# Patient Record
Sex: Male | Born: 1995 | Race: Black or African American | Hispanic: No | Marital: Married | Smoking: Never smoker
Health system: Southern US, Community
[De-identification: ages and names within clinical notes are randomized; demographics above are authoritative.]

---

## 2020-03-25 NOTE — Progress Notes (Deleted)
    New patient visit   Patient: Arthur Reese   DOB: 1995/12/19   23 y.o. Male  MRN: [id number] Visit Date: 03/26/2020  Today's healthcare provider: Jairo Ben, FNP   No chief complaint on file.  Subjective    Arthur Reese is a 24 y.o. male who presents today as a new patient to establish care.  HPI  ***  No past medical history on file. *** The histories are not reviewed yet. Please review them in the "History" navigator section and refresh this SmartLink. No family status information on file.   No family history on file. Social History   Socioeconomic History  . Marital status: Not on file    Spouse name: Not on file  . Number of children: Not on file  . Years of education: Not on file  . Highest education level: Not on file  Occupational History  . Not on file  Tobacco Use  . Smoking status: Not on file  Substance and Sexual Activity  . Alcohol use: Not on file  . Drug use: Not on file  . Sexual activity: Not on file  Other Topics Concern  . Not on file  Social History Narrative  . Not on file   Social Determinants of Health   Financial Resource Strain:   . Difficulty of Paying Living Expenses:   Food Insecurity:   . Worried About Programme researcher, broadcasting/film/video in the Last Year:   . Barista in the Last Year:   Transportation Needs:   . Freight forwarder (Medical):   Marland Kitchen Lack of Transportation (Non-Medical):   Physical Activity:   . Days of Exercise per Week:   . Minutes of Exercise per Session:   Stress:   . Feeling of Stress :   Social Connections:   . Frequency of Communication with Friends and Family:   . Frequency of Social Gatherings with Friends and Family:   . Attends Religious Services:   . Active Member of Clubs or Organizations:   . Attends Banker Meetings:   Marland Kitchen Marital Status:    No outpatient medications prior to visit.   No facility-administered medications prior to visit.   Not on File   There is no  immunization history on file for this patient.  Health Maintenance  Topic Date Due  . Hepatitis C Screening  Never done  . HIV Screening  Never done  . TETANUS/TDAP  Never done  . INFLUENZA VACCINE  04/25/2020    No care team member to display  Review of Systems  {Heme  Chem  Endocrine  Serology  Results Review (optional):[id number]::" "}  Objective    There were no vitals taken for this visit. Physical Exam ***  Depression Screen No flowsheet data found. No results found for any visits on 03/26/20.  Assessment & Plan     ***  No follow-ups on file.     {provider attestation***:1}   Jairo Ben, FNP  [facility] [phone] (phone) [phone] (fax)  [facility]

## 2020-03-26 ENCOUNTER — Ambulatory Visit: Payer: Self-pay | Admitting: Adult Health

## 2020-06-15 ENCOUNTER — Ambulatory Visit
Admission: RE | Admit: 2020-06-15 | Discharge: 2020-06-15 | Disposition: A | Discharge status: Home / Self Care | Payer: Medicaid Other | Attending: Emergency Medicine | Admitting: Emergency Medicine

## 2020-06-15 ENCOUNTER — Ambulatory Visit
Admission: EM | Admit: 2020-06-15 | Discharge: 2020-06-15 | Disposition: A | Discharge status: Home / Self Care | Payer: Medicaid Other | Attending: Emergency Medicine | Admitting: Emergency Medicine

## 2020-06-15 ENCOUNTER — Other Ambulatory Visit: Payer: Self-pay

## 2020-06-15 ENCOUNTER — Ambulatory Visit
Admission: RE | Admit: 2020-06-15 | Discharge: 2020-06-15 | Disposition: A | Discharge status: Home / Self Care | Payer: Medicaid Other | Source: Ambulatory Visit | Attending: Emergency Medicine | Admitting: Emergency Medicine

## 2020-06-15 DIAGNOSIS — M25571 Pain in right ankle and joints of right foot: Secondary | ICD-10-CM | POA: Insufficient documentation

## 2020-06-15 MED ORDER — IBUPROFEN 800 MG PO TABS: 800.0000 mg | ORAL_TABLET | Freq: Three times a day (TID) | ORAL | 0 refills | Status: AC | PRN

## 2020-06-15 NOTE — ED Triage Notes (Signed)
Patient reports he injured his right ankle playing basketball last night.

## 2020-06-15 NOTE — Discharge Instructions (Addendum)
Go to [facility] for your xray.  I will call you with the result afterward.    Take the ibuprofen as prescribed.    Rest and elevate your ankle.  Apply ice packs 2-3 times a day for up to 20 minutes each.  Wear the Ace wrap as needed for comfort.    Follow up with an orthopedist if you symptoms are not improving.

## 2020-06-15 NOTE — ED Provider Notes (Signed)
Renaldo Fiddler    CSN: [id number] Arrival date & time: 06/15/20  0915      History   Chief Complaint Chief Complaint  Patient presents with  . Injury  . Foot Pain  . Foot Swelling    HPI Arthur Reese is a 24 y.o. male.   Patient presents with pain in his right ankle since yesterday evening.  He states he was playing basketball and jumped and landed on his ankle which caused pain and swelling.  He is unsure what position his ankle was and when he landed.  He states it is painful to bear weight.  He denies numbness or paresthesias.  No treatments attempted at home.  The history is provided by the patient.    History reviewed. No pertinent past medical history.  There are no problems to display for this patient.   History reviewed. No pertinent surgical history.     Home Medications    Prior to Admission medications   Medication Sig Start Date End Date Taking? Authorizing Provider  ibuprofen (ADVIL) 800 MG tablet Take 1 tablet (800 mg total) by mouth every 8 (eight) hours as needed. 06/15/20   Mickie Bail, NP    Family History History reviewed. No pertinent family history.  Social History Social History   Tobacco Use  . Smoking status: Never Smoker  . Smokeless tobacco: Never Used  Substance Use Topics  . Alcohol use: Yes    Comment: occasionally  . Drug use: Yes    Types: Marijuana     Allergies   Patient has no known allergies.   Review of Systems Review of Systems  Constitutional: Negative for chills and fever.  HENT: Negative for ear pain and sore throat.   Eyes: Negative for pain and visual disturbance.  Respiratory: Negative for cough and shortness of breath.   Cardiovascular: Negative for chest pain and palpitations.  Gastrointestinal: Negative for abdominal pain and vomiting.  Genitourinary: Negative for dysuria and hematuria.  Musculoskeletal: Positive for arthralgias, gait problem and joint swelling. Negative for back pain.  Skin:  Negative for color change and rash.  Neurological: Negative for seizures and syncope.  All other systems reviewed and are negative.    Physical Exam Triage Vital Signs ED Triage Vitals [06/15/20 0929]  Enc Vitals Group     BP      Pulse      Resp      Temp      Temp src      SpO2      Weight      Height      Head Circumference      Peak Flow      Pain Score 8     Pain Loc      Pain Edu?      Excl. in GC?    No data found.  Updated Vital Signs BP 118/78   Pulse 71   Temp 99.1 F (37.3 C)   Resp 14   SpO2 98%   Visual Acuity Right Eye Distance:   Left Eye Distance:   Bilateral Distance:    Right Eye Near:   Left Eye Near:    Bilateral Near:     Physical Exam Vitals and nursing note reviewed.  Constitutional:      General: He is not in acute distress.    Appearance: He is well-developed.  HENT:     Head: Normocephalic and atraumatic.     Mouth/Throat:  Mouth: Mucous membranes are moist.  Eyes:     Conjunctiva/sclera: Conjunctivae normal.  Cardiovascular:     Rate and Rhythm: Normal rate and regular rhythm.     Heart sounds: No murmur heard.   Pulmonary:     Effort: Pulmonary effort is normal. No respiratory distress.     Breath sounds: Normal breath sounds.  Abdominal:     Palpations: Abdomen is soft.     Tenderness: There is no abdominal tenderness.  Musculoskeletal:        General: Swelling and tenderness present. No deformity.     Cervical back: Neck supple.       Legs:     Comments: Right ankle mild edema and tenderness. 2+ pulses, sensation intact.  ROM limited by discomfort.  No lesions, ecchymosis, erythema.   Skin:    General: Skin is warm and dry.     Capillary Refill: Capillary refill takes less than 2 seconds.     Findings: No bruising, erythema, lesion or rash.  Neurological:     General: No focal deficit present.     Mental Status: He is alert and oriented to person, place, and time.     Sensory: No sensory deficit.      Motor: No weakness.     Gait: Gait abnormal.  Psychiatric:        Mood and Affect: Mood normal.        Behavior: Behavior normal.      UC Treatments / Results  Labs (all labs ordered are listed, but only abnormal results are displayed) Labs Reviewed - No data to display  EKG   Radiology DG Ankle Complete Right  Result Date: 06/15/2020 CLINICAL DATA:  Basketball injury. EXAM: RIGHT ANKLE - COMPLETE 3+ VIEW COMPARISON:  No prior. FINDINGS: No acute bony or joint abnormality identified. No evidence of fracture or dislocation. Mild soft tissue swelling cannot be excluded. IMPRESSION: No acute bony abnormality. Electronically Signed   By: Maisie Fus  Register   On: 06/15/2020 10:31    Procedures Procedures (including critical care time)  Medications Ordered in UC Medications - No data to display  Initial Impression / Assessment and Plan / UC Course  I have reviewed the triage vital signs and the nursing notes.  Pertinent labs & imaging results that were available during my care of the patient were reviewed by me and considered in my medical decision making (see chart for details).   Acute right ankle pain.  Xray negative.  Treating with ibuprofen, rest, elevation, ice packs, Ace wrap, crutches.  Instructed patient to follow-up with orthopedics if his symptoms are not improving.  Patient agrees to plan of care.   Final Clinical Impressions(s) / UC Diagnoses   Final diagnoses:  Acute right ankle pain     Discharge Instructions     Go to [facility] for your xray.  I will call you with the result afterward.    Take the ibuprofen as prescribed.    Rest and elevate your ankle.  Apply ice packs 2-3 times a day for up to 20 minutes each.  Wear the Ace wrap as needed for comfort.    Follow up with an orthopedist if you symptoms are not improving.       ED Prescriptions    Medication Sig Dispense Auth. Provider   ibuprofen (ADVIL) 800 MG tablet  Take 1 tablet (800 mg total) by mouth every 8 (eight) hours as needed. 21 tablet Mickie Bail, NP  PDMP not reviewed this encounter.   Mickie Bail, NP 06/15/20 1043

## 2021-03-24 IMAGING — CR DG ANKLE COMPLETE 3+V*R*
1 series · 3 of 3 positions shown · non-contrast
Comparison: No prior.

CLINICAL DATA: Basketball injury.

EXAM:
RIGHT ANKLE - COMPLETE 3+ VIEW

[Series 1: dg ankle complete right · 0.14mm/px · 3 of 3 slices shown]
[im 1/3]
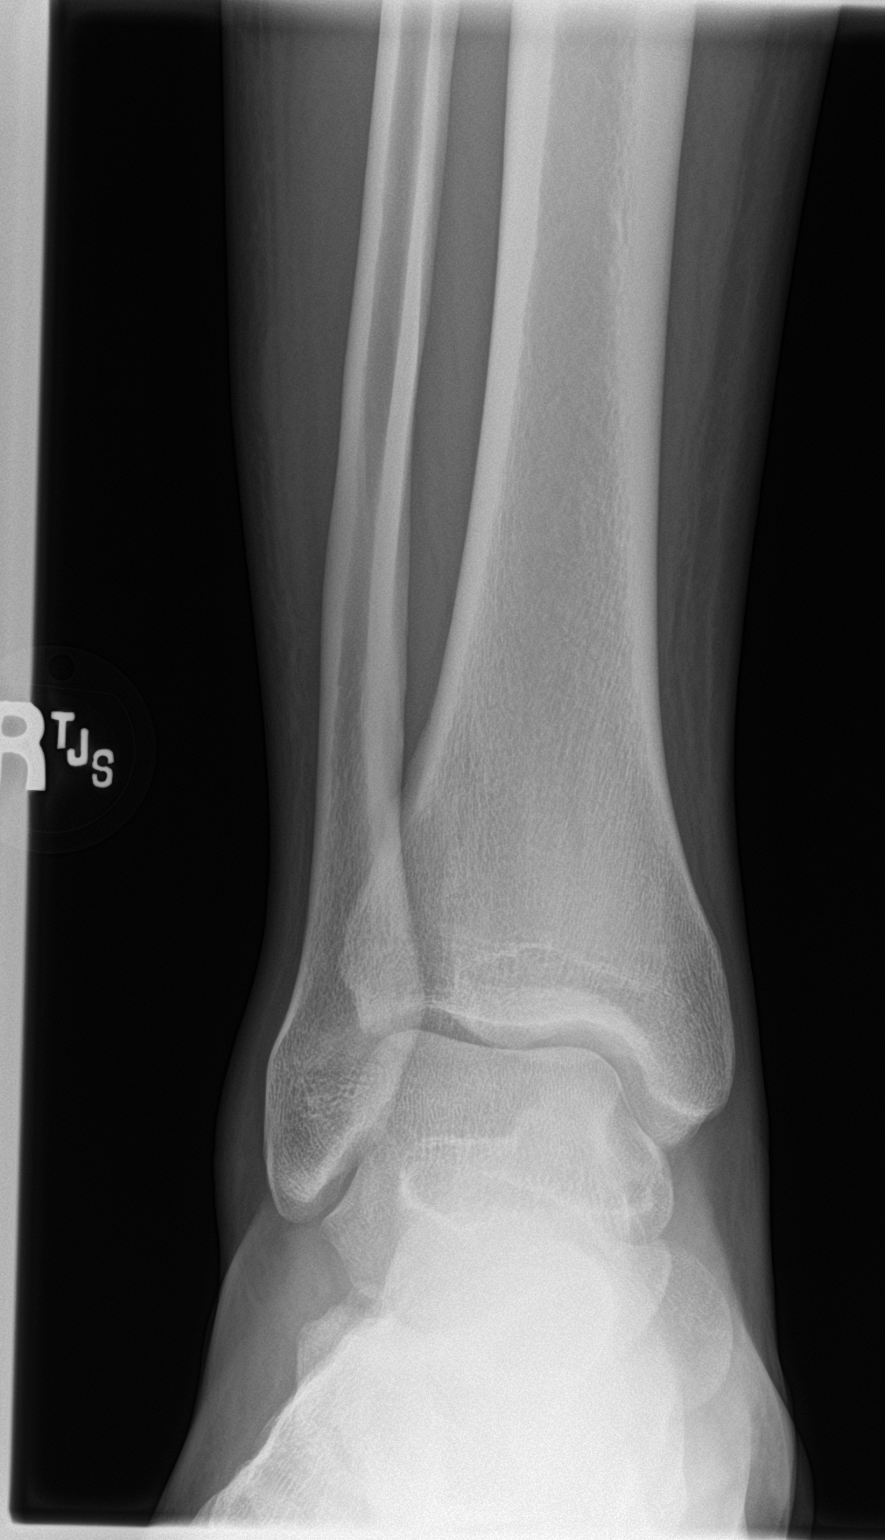
[im 2/3]
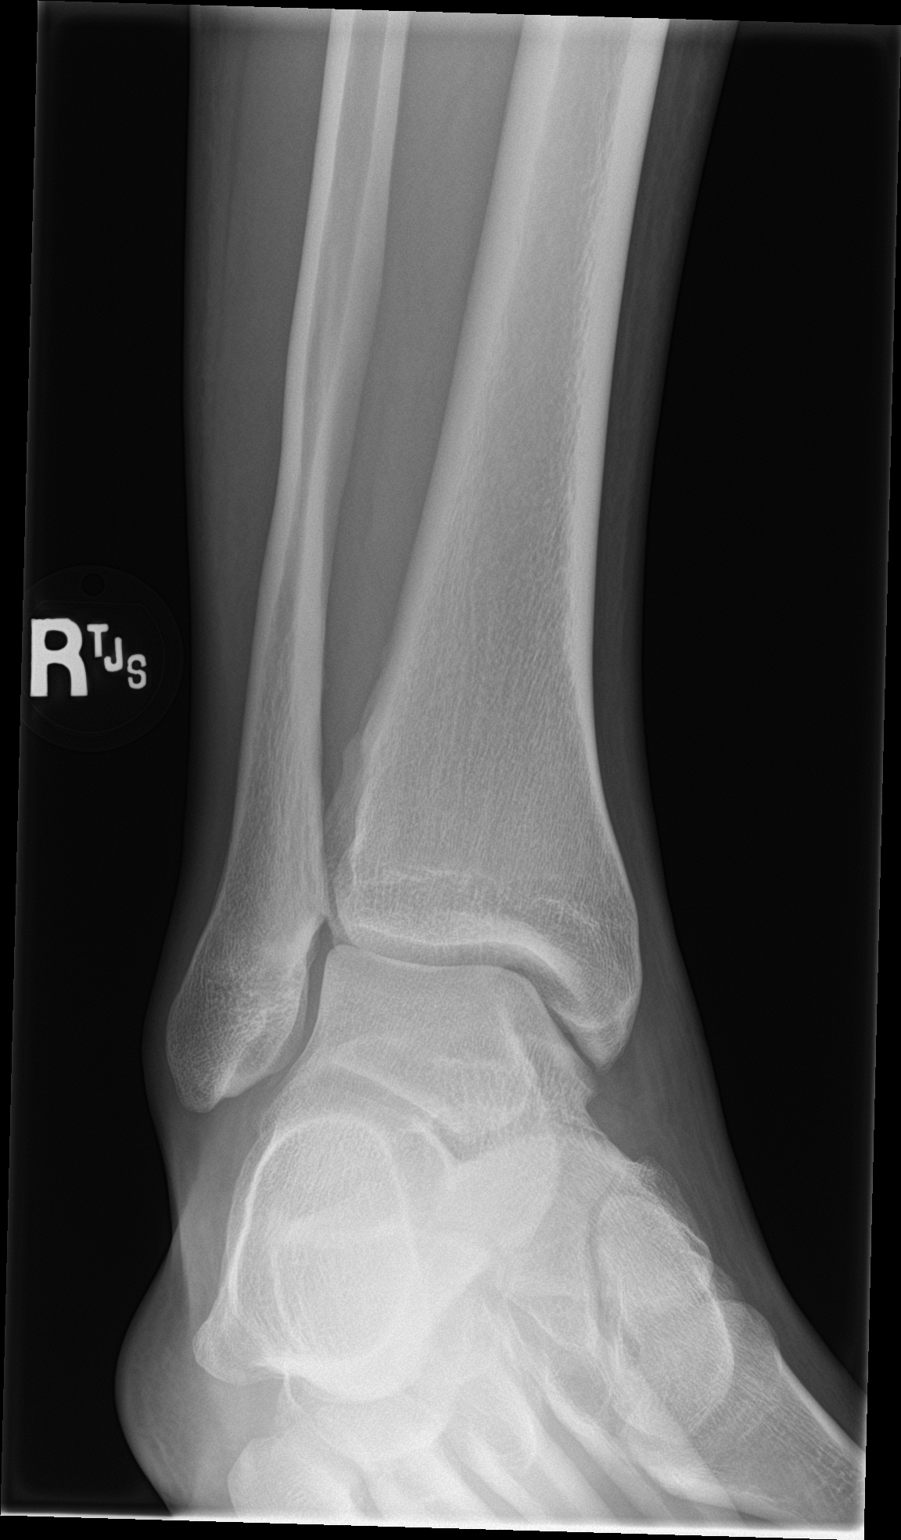
[im 3/3]
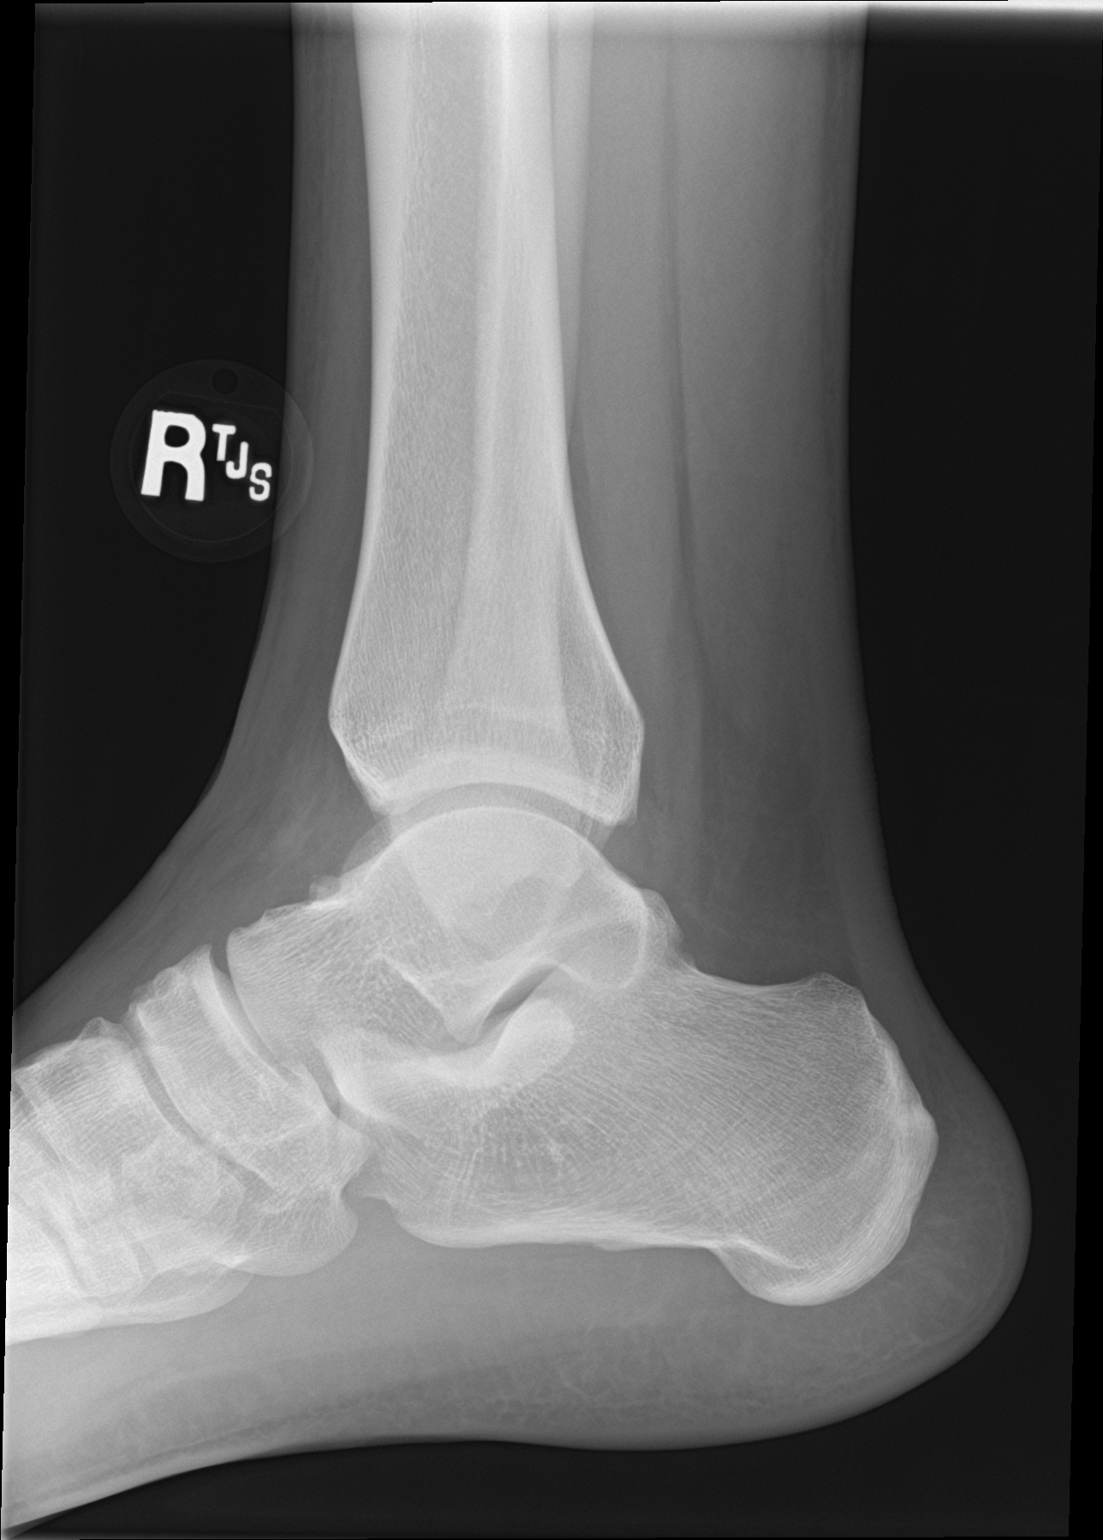

[3 of 3 positions shown; findings below may reference images not displayed]

FINDINGS: No acute bony or joint abnormality identified. No evidence of
fracture or dislocation. Mild soft tissue swelling cannot be
excluded.
IMPRESSION: No acute bony abnormality.

## 2022-12-22 ENCOUNTER — Emergency Department
Admission: EM | Admit: 2022-12-22 | Discharge: 2022-12-22 | Disposition: A | Discharge status: Home / Self Care | Payer: Medicaid Other | Attending: Emergency Medicine | Admitting: Emergency Medicine

## 2022-12-22 ENCOUNTER — Other Ambulatory Visit: Payer: Self-pay

## 2022-12-22 DIAGNOSIS — Z20822 Contact with and (suspected) exposure to covid-19: Secondary | ICD-10-CM | POA: Insufficient documentation

## 2022-12-22 DIAGNOSIS — R52 Pain, unspecified: Secondary | ICD-10-CM | POA: Insufficient documentation

## 2022-12-22 DIAGNOSIS — H73893 Other specified disorders of tympanic membrane, bilateral: Secondary | ICD-10-CM | POA: Insufficient documentation

## 2022-12-22 DIAGNOSIS — R1110 Vomiting, unspecified: Secondary | ICD-10-CM | POA: Insufficient documentation

## 2022-12-22 DIAGNOSIS — R059 Cough, unspecified: Secondary | ICD-10-CM | POA: Insufficient documentation

## 2022-12-22 DIAGNOSIS — R0981 Nasal congestion: Secondary | ICD-10-CM | POA: Insufficient documentation

## 2022-12-22 DIAGNOSIS — R197 Diarrhea, unspecified: Secondary | ICD-10-CM | POA: Diagnosis not present

## 2022-12-22 DIAGNOSIS — J111 Influenza due to unidentified influenza virus with other respiratory manifestations: Secondary | ICD-10-CM

## 2022-12-22 LAB — SARS CORONAVIRUS 2 BY RT PCR: SARS Coronavirus 2 by RT PCR: NEGATIVE

## 2022-12-22 MED ORDER — ONDANSETRON 4 MG PO TBDP: 4.0000 mg | ORAL_TABLET | Freq: Three times a day (TID) | ORAL | 0 refills | Status: AC | PRN

## 2022-12-22 MED ORDER — KETOROLAC TROMETHAMINE 15 MG/ML IJ SOLN
15.0000 mg | Freq: Once | INTRAMUSCULAR | Status: AC
  Administered 2022-12-22: 15 mg via INTRAMUSCULAR
  Filled 2022-12-22: qty 1

## 2022-12-22 MED ORDER — ONDANSETRON 4 MG PO TBDP
4.0000 mg | ORAL_TABLET | Freq: Once | ORAL | Status: AC
  Administered 2022-12-22: 4 mg via ORAL
  Filled 2022-12-22: qty 1

## 2022-12-22 MED ORDER — BENZONATATE 100 MG PO CAPS: 100.0000 mg | ORAL_CAPSULE | Freq: Three times a day (TID) | ORAL | 0 refills | Status: AC | PRN

## 2022-12-22 NOTE — ED Provider Notes (Signed)
[facility] Provider Note  Patient Contact: 4:54 PM (approximate)   History   Generalized Body Aches   HPI  Aspen Jha is a 27 y.o. male presents to the emergency department with vomiting, diarrhea, body aches, cough and nasal congestion.  Patient's girlfriend has also had similar symptoms and was diagnosed with flu B.  No chest pain, chest tightness or abdominal pain.      Physical Exam   Triage Vital Signs: ED Triage Vitals  Enc Vitals Group     BP 12/22/22 1556 129/86     Pulse Rate 12/22/22 1556 89     Resp 12/22/22 1556 18     Temp 12/22/22 1556 98.6 F (37 C)     Temp src --      SpO2 12/22/22 1556 98 %     Weight 12/22/22 1557 215 lb (97.5 kg)     Height 12/22/22 1557 6\' 1"  (1.854 m)     Head Circumference --      Peak Flow --      Pain Score 12/22/22 1557 7     Pain Loc --      Pain Edu? --      Excl. in [place]? --     Most recent vital signs: Vitals:   12/22/22 1556  BP: 129/86  Pulse: 89  Resp: 18  Temp: 98.6 F (37 C)  SpO2: 98%     Constitutional: Alert and oriented. Patient is lying supine. Eyes: Conjunctivae are normal. PERRL. EOMI. Head: Atraumatic. ENT:      Ears: Tympanic membranes are mildly injected with mild effusion bilaterally.       Nose: No congestion/rhinnorhea.      Mouth/Throat: Mucous membranes are moist. Posterior pharynx is mildly erythematous.  Hematological/Lymphatic/Immunilogical: No cervical lymphadenopathy.  Cardiovascular: Normal rate, regular rhythm. Normal S1 and S2.  Good peripheral circulation. Respiratory: Normal respiratory effort without tachypnea or retractions. Lungs CTAB. Good air entry to the bases with no decreased or absent breath sounds. Gastrointestinal: Bowel sounds 4 quadrants. Soft and nontender to palpation. No guarding or rigidity. No palpable masses. No distention. No CVA tenderness. Musculoskeletal: Full range of motion to all extremities. No gross deformities  appreciated. Neurologic:  Normal speech and language. No gross focal neurologic deficits are appreciated.  Skin:  Skin is warm, dry and intact. No rash noted. Psychiatric: Mood and affect are normal. Speech and behavior are normal. Patient exhibits appropriate insight and judgement.   ED Results / Procedures / Treatments   Labs (all labs ordered are listed, but only abnormal results are displayed) Labs Reviewed  SARS CORONAVIRUS 2 BY RT PCR       PROCEDURES:  Critical Care performed: No  Procedures   MEDICATIONS ORDERED IN ED: Medications  ketorolac (TORADOL) 15 MG/ML injection 15 mg (has no administration in time range)  ondansetron (ZOFRAN-ODT) disintegrating tablet 4 mg (has no administration in time range)     IMPRESSION / MDM / ASSESSMENT AND PLAN / ED COURSE  I reviewed the triage vital signs and the nursing notes.                              Assessment and plan Flulike illness 27 year old male presents to the emergency with flulike symptoms.  Vital signs are reassuring at triage.  On exam, patient was alert, active and nontoxic-appearing.  Patient tested negative for COVID-19.  Suspect influenza B infection given girlfriend current symptoms.  Patient was discharged with [facility] and Zofran.  Patient was given injection of Toradol for body aches while in the emergency department and his first dose of Zofran.      FINAL CLINICAL IMPRESSION(S) / ED DIAGNOSES   Final diagnoses:  Influenza-like illness     Rx / DC Orders   ED Discharge Orders          Ordered    ondansetron (ZOFRAN-ODT) 4 MG disintegrating tablet  Every 8 hours PRN        12/22/22 1649    benzonatate (TESSALON PERLES) 100 MG capsule  3 times daily PRN        12/22/22 1649             Note:  This document was prepared using Dragon voice recognition software and may include unintentional dictation errors.   Vallarie Mare Mina, PA-C 12/22/22 1655    Rada Hay,  MD 12/22/22 [phone]

## 2022-12-22 NOTE — ED Triage Notes (Signed)
Pt to ED for generalized body aches, fatigue and nausea for past few days. States girlfriend tested positive for flu. Pt ambulatory to triage. NAD noted.  Pt drinking water in triage

## 2023-03-09 ENCOUNTER — Emergency Department
Admission: EM | Admit: 2023-03-09 | Discharge: 2023-03-09 | Disposition: A | Discharge status: Home / Self Care | Payer: Medicaid Other | Attending: Emergency Medicine | Admitting: Emergency Medicine

## 2023-03-09 ENCOUNTER — Encounter: Payer: Self-pay | Admitting: Emergency Medicine

## 2023-03-09 ENCOUNTER — Emergency Department: Payer: Medicaid Other

## 2023-03-09 ENCOUNTER — Other Ambulatory Visit: Payer: Self-pay

## 2023-03-09 DIAGNOSIS — S8991XA Unspecified injury of right lower leg, initial encounter: Secondary | ICD-10-CM | POA: Diagnosis present

## 2023-03-09 DIAGNOSIS — S82141A Displaced bicondylar fracture of right tibia, initial encounter for closed fracture: Secondary | ICD-10-CM

## 2023-03-09 DIAGNOSIS — W2105XA Struck by basketball, initial encounter: Secondary | ICD-10-CM | POA: Insufficient documentation

## 2023-03-09 DIAGNOSIS — S82144A Nondisplaced bicondylar fracture of right tibia, initial encounter for closed fracture: Secondary | ICD-10-CM | POA: Insufficient documentation

## 2023-03-09 DIAGNOSIS — Y9367 Activity, basketball: Secondary | ICD-10-CM | POA: Insufficient documentation

## 2023-03-09 MED ORDER — NAPROXEN 500 MG PO TABS: 500.0000 mg | ORAL_TABLET | Freq: Two times a day (BID) | ORAL | 0 refills | Status: AC

## 2023-03-09 NOTE — Discharge Instructions (Signed)
Call and schedule an appointment with the orthopedic specialist.  Wear the immobilizer and use your crutches.  Ice the knee off and on over the next few days.  Return to the ER for symptoms that change or worsen if unable to schedule an appointment.

## 2023-03-09 NOTE — ED Triage Notes (Signed)
Patient to ED via POV for right knee pain. Patient states he was playing basketball when it got kneed by another player. Ambulatory with limp- unable to drive due to pain.

## 2023-03-09 NOTE — ED Notes (Signed)
See triage note   Presents with pain to right knee  States he was hit in the knee while playing b/b  No deformity noted   Increased pain with ambulation  Ambulates with limp

## 2023-03-16 NOTE — ED Provider Notes (Signed)
[facility] Provider Note    Event Date/Time   First MD Initiated Contact with Patient 03/09/23 [phone]     (approximate)   History   Knee Pain   HPI  Arthur Reese is a 27 y.o. male  with no significant past medical history and as listed in EMR presents to the emergency department for evaluation of right knee pain. While playing basketball another player's knee collided with the right side of his knee. Pain with ambulation.      Physical Exam   Triage Vital Signs: ED Triage Vitals  Enc Vitals Group     BP 03/09/23 0744 (!) 135/98     Pulse Rate 03/09/23 0744 95     Resp 03/09/23 0744 18     Temp 03/09/23 0744 98.5 F (36.9 C)     Temp Source 03/09/23 0744 Oral     SpO2 03/09/23 0744 99 %     Weight 03/09/23 0818 216 lb 0.8 oz (98 kg)     Height 03/09/23 0818 6\' 1"  (1.854 m)     Head Circumference --      Peak Flow --      Pain Score 03/09/23 0743 8     Pain Loc --      Pain Edu? --      Excl. in GC? --     Most recent vital signs: Vitals:   03/09/23 0744 03/09/23 0916  BP: (!) 135/98 128/70  Pulse: 95 80  Resp: 18 17  Temp: 98.5 F (36.9 C) 98.4 F (36.9 C)  SpO2: 99% 98%    General: Awake, no distress.  CV:  Good peripheral perfusion.  Resp:  Normal effort.  Abd:  No distention.  Other:  Point of tenderness over the right lateral proximal tibia.   ED Results / Procedures / Treatments   Labs (all labs ordered are listed, but only abnormal results are displayed) Labs Reviewed - No data to display   EKG  Not indicated.   RADIOLOGY  Image and radiology report reviewed and interpreted by me. Radiology report consistent with the same.  Linear lucency and cortical step-off along the posterior aspect of right tibial plateau,  PROCEDURES:  Critical Care performed: No  Procedures   MEDICATIONS ORDERED IN ED:  Medications - No data to display   IMPRESSION / MDM / ASSESSMENT AND PLAN / ED COURSE   I have reviewed  the triage note.  Differential diagnosis includes, but is not limited to, contusion, knee sprain, fracture.  Patient's presentation is most consistent with acute complicated illness / injury requiring diagnostic workup.  27 year old male presenting to the emergency department for treatment and evaluation of right knee pain.  See HPI for further details.  X-ray is concerning for a fracture along the posterior aspect of the right tibial plateau.  Plan will be to put him in a knee immobilizer, have him use crutches and remain nonweightbearing until follow-up with orthopedics. He was also advised to rest, ice, and elevate often.      FINAL CLINICAL IMPRESSION(S) / ED DIAGNOSES   Final diagnoses:  Tibial plateau fracture, right, closed, initial encounter     Rx / DC Orders   ED Discharge Orders          Ordered    naproxen (NAPROSYN) 500 MG tablet  2 times daily with meals        03/09/23 0855             Note:  This document was prepared using Dragon voice recognition software and may include unintentional dictation errors.   Chinita Pester, FNP 03/16/23 0900    Merwyn Katos, MD 03/16/23 [phone]

## 2023-09-09 ENCOUNTER — Emergency Department
Admission: EM | Admit: 2023-09-09 | Discharge: 2023-09-09 | Disposition: A | Discharge status: Home / Self Care | Payer: Medicaid Other | Attending: Student in an Organized Health Care Education/Training Program | Admitting: Student in an Organized Health Care Education/Training Program

## 2023-09-09 ENCOUNTER — Other Ambulatory Visit: Payer: Self-pay

## 2023-09-09 DIAGNOSIS — R112 Nausea with vomiting, unspecified: Secondary | ICD-10-CM | POA: Diagnosis present

## 2023-09-09 LAB — URINALYSIS, ROUTINE W REFLEX MICROSCOPIC
Bacteria, UA: NONE SEEN
Bilirubin Urine: NEGATIVE
Glucose, UA: NEGATIVE mg/dL
Ketones, ur: 5 mg/dL — AB
Leukocytes,Ua: NEGATIVE
Nitrite: NEGATIVE
Protein, ur: NEGATIVE mg/dL
Specific Gravity, Urine: 1.016 (ref 1.005–1.030)
pH: 5 (ref 5.0–8.0)

## 2023-09-09 LAB — CBC
HCT: 43.6 % (ref 39.0–52.0)
Hemoglobin: 15.2 g/dL (ref 13.0–17.0)
MCH: 30.6 pg (ref 26.0–34.0)
MCHC: 34.9 g/dL (ref 30.0–36.0)
MCV: 87.7 fL (ref 80.0–100.0)
Platelets: 287 10*3/uL (ref 150–400)
RBC: 4.97 MIL/uL (ref 4.22–5.81)
RDW: 13.2 % (ref 11.5–15.5)
WBC: 10.4 10*3/uL (ref 4.0–10.5)
nRBC: 0 % (ref 0.0–0.2)

## 2023-09-09 LAB — LIPASE, BLOOD: Lipase: 31 U/L (ref 11–51)

## 2023-09-09 LAB — CK: Total CK: 181 U/L (ref 49–397)

## 2023-09-09 LAB — COMPREHENSIVE METABOLIC PANEL
ALT: 58 U/L — ABNORMAL HIGH (ref 0–44)
AST: 49 U/L — ABNORMAL HIGH (ref 15–41)
Albumin: 4.4 g/dL (ref 3.5–5.0)
Alkaline Phosphatase: 69 U/L (ref 38–126)
Anion gap: 10 (ref 5–15)
BUN: 8 mg/dL (ref 6–20)
CO2: 24 mmol/L (ref 22–32)
Calcium: 8.9 mg/dL (ref 8.9–10.3)
Chloride: 102 mmol/L (ref 98–111)
Creatinine, Ser: 0.84 mg/dL (ref 0.61–1.24)
GFR, Estimated: >60 mL/min (ref >60)
Glucose, Bld: 97 mg/dL (ref 70–99)
Potassium: 3.8 mmol/L (ref 3.5–5.1)
Sodium: 136 mmol/L (ref 135–145)
Total Bilirubin: 0.5 mg/dL (ref <1.2)
Total Protein: 8.1 g/dL (ref 6.5–8.1)

## 2023-09-09 MED ORDER — PROMETHAZINE HCL 12.5 MG PO TABS: 12.5000 mg | ORAL_TABLET | Freq: Four times a day (QID) | ORAL | 0 refills | Status: AC | PRN

## 2023-09-09 MED ORDER — METOCLOPRAMIDE HCL 5 MG/ML IJ SOLN
10.0000 mg | Freq: Once | INTRAMUSCULAR | Status: AC
  Administered 2023-09-09: 10 mg via INTRAVENOUS
  Filled 2023-09-09: qty 2

## 2023-09-09 MED ORDER — SODIUM CHLORIDE 0.9 % IV BOLUS
1000.0000 mL | Freq: Once | INTRAVENOUS | Status: AC
  Administered 2023-09-09: 1000 mL via INTRAVENOUS

## 2023-09-09 NOTE — ED Triage Notes (Addendum)
Vomiting since Thursday.  Seen through [facility] on Friday, nausea pills given, but not effective.

## 2023-09-09 NOTE — ED Provider Notes (Signed)
[facility] Provider Note    Event Date/Time   First MD Initiated Contact with Patient 09/09/23 [phone]     (approximate)   History   Emesis   HPI  Arthur Reese is a 27 y.o. male no significant past medical history presented to the ER for evaluation of nausea vomiting since Thursday.  Able to tolerate water but unable to keep much else down due to frequent emesis.  No measured fevers.  Had additional episodes of emesis this morning so wanted to be evaluated.     Physical Exam   Triage Vital Signs: ED Triage Vitals [09/09/23 0707]  Encounter Vitals Group     BP (!) 157/90     Systolic BP Percentile      Diastolic BP Percentile      Pulse Rate 88     Resp 16     Temp 98.5 F (36.9 C)     Temp Source Oral     SpO2 97 %     Weight 250 lb (113.4 kg)     Height 6\' 1"  (1.854 m)     Head Circumference      Peak Flow      Pain Score 0     Pain Loc      Pain Education      Exclude from Growth Chart     Most recent vital signs: Vitals:   09/09/23 0707  BP: (!) 157/90  Pulse: 88  Resp: 16  Temp: 98.5 F (36.9 C)  SpO2: 97%     Constitutional: Alert  Eyes: Conjunctivae are normal.  Head: Atraumatic. Nose: No congestion/rhinnorhea. Mouth/Throat: Mucous membranes are moist.   Neck: Painless ROM.  Cardiovascular:   Good peripheral circulation. Respiratory: Normal respiratory effort.  No retractions.  Gastrointestinal: Soft and nontender in all four quadrants  Musculoskeletal:  no deformity Neurologic:  MAE spontaneously. No gross focal neurologic deficits are appreciated.  Skin:  Skin is warm, dry and intact. No rash noted. Psychiatric: Mood and affect are normal. Speech and behavior are normal.    ED Results / Procedures / Treatments   Labs (all labs ordered are listed, but only abnormal results are displayed) Labs Reviewed  COMPREHENSIVE METABOLIC PANEL - Abnormal; Notable for the following components:      Result Value   AST 49 (*)     ALT 58 (*)    All other components within normal limits  URINALYSIS, ROUTINE W REFLEX MICROSCOPIC - Abnormal; Notable for the following components:   Color, Urine YELLOW (*)    APPearance CLEAR (*)    Hgb urine dipstick MODERATE (*)    Ketones, ur 5 (*)    All other components within normal limits  LIPASE, BLOOD  CBC  CK     EKG     RADIOLOGY Please see ED Course for my review and interpretation.  I personally reviewed all radiographic images ordered to evaluate for the above acute complaints and reviewed radiology reports and findings.  These findings were personally discussed with the patient.  Please see medical record for radiology report.    PROCEDURES:  Critical Care performed: No  Procedures   MEDICATIONS ORDERED IN ED: Medications  sodium chloride 0.9 % bolus 1,000 mL (1,000 mLs Intravenous New Bag/Given 09/09/23 0919)  metoCLOPramide (REGLAN) injection 10 mg (10 mg Intravenous Given 09/09/23 0919)     IMPRESSION / MDM / ASSESSMENT AND PLAN / ED COURSE  I reviewed the triage vital signs and the nursing  notes.                              Differential diagnosis includes, but is not limited to, enteritis, gastritis, colitis, dehydration, electrolyte abnormality, DKA, foodborne illness, appendicitis  Patient presenting to the ER for evaluation of symptoms as described above.  Based on symptoms, risk factors and considered above differential, this presenting complaint could reflect a potentially life-threatening illness therefore the patient will be placed on continuous pulse oximetry and telemetry for monitoring.  Laboratory evaluation will be sent to evaluate for the above complaints.  Nontoxic-appearing but does appear dehydrated will give IV fluids as well as antiemetic.  His abdominal exam is soft and benign.   Clinical Course as of 09/09/23 1103  Sun Sep 09, 2023  1046 CK is normal.  IV fluids infusing.  No additional vomiting.  Will p.o. challenge.  [PR]  1103 Patient tolerating p.o.  No additional emesis.  Do not feel that further diagnostic testing clinically indicated given this presentation.  Does appear appropriate for outpatient follow-up. [PR]    Clinical Course User Index [PR] Willy Eddy, MD     FINAL CLINICAL IMPRESSION(S) / ED DIAGNOSES   Final diagnoses:  Nausea and vomiting, unspecified vomiting type     Rx / DC Orders   ED Discharge Orders          Ordered    promethazine (PHENERGAN) 12.5 MG tablet  Every 6 hours PRN        09/09/23 1103             Note:  This document was prepared using Dragon voice recognition software and may include unintentional dictation errors.    Willy Eddy, MD 09/09/23 [phone]

## 2024-01-28 ENCOUNTER — Ambulatory Visit: Admitting: Family Medicine

## 2024-01-28 NOTE — Progress Notes (Deleted)
 New Patient Office Visit  Subjective   Patient ID: Arthur Reese, male    DOB: 10/13/1995  Age: 28 y.o. MRN: [id number]  CC: No chief complaint on file.   HPI Arthur Reese is a ***-year-old male/male who presents to establish with [facility] at [facility].   CC: Patient here to establish care  Last PCP:  Specialists: PMHx: cervical radiculopathy   HEADACHE:   Arthur Reese reports new onset headaches. Headaches have been going on for *** Onset of current headache:  Duration: {Blank single:19197::"chronic","days","weeks","months","years"} Quality: {Blank multiple:19196::"sharp","dull","aching","burning","cramping","ill-defined","itchy","pressure-like","pulling","shooting","sore","stabbing","tender","tearing","throbbing"} Severity: {Blank single:19197::"mild","moderate","severe","1/10","2/10","3/10","4/10","5/10","6/10","7/10","8/10","9/10","10/10"} Frequency: {Blank single:19197::"constant","intermittent","occasional","rare","every few minutes","a few times a hour","a few times a day","a few times a week","a few times a month","a few times a year"} Radiation: {Blank single:19197::"yes","no"} Time of day headache occurs: *** Alleviating factors: *** Aggravating factors: *** Treatments attempted: Treatments attempted: {Blank multiple:19196::"none","rest","ice","heat","APAP","ibuprofen ","aleve ", excedrine","triptans","propranolol","topamax","amitriptyline"}   Associated Symptoms Aura: {Blank single:19197::"yes","no"} Nausea:  {Blank single:19197::"yes","no"} Vomiting: {Blank single:19197::"yes","no"} Photophobia:  {Blank single:19197::"yes","no"} Phonophobia:  {Blank single:19197::"yes","no"} Sinus pain/pressure: {YES/NO/WILD CARDS:18581}  Family hx migraine: {YES/NO/WILD XLKGM:01027}  Personal stressors: {YES/NO/WILD CARDS:18581}  Relation to menstrual cycle: {YES/NO/WILD OZDGU:44034}  Fever/New Rash: {YES/NO/WILD CARDS:18581}   Red Flags Fever: {YES/NO/WILD  CARDS:18581}  Neck pain/stiffness: {YES/NO/WILD CARDS:18581}  Vision/speech/swallow/hearing difficulty: {YES/NO/WILD CARDS:18581}  Focal weakness/numbness: {YES/NO/WILD CARDS:18581}  Altered mental status: {YES/NO/WILD CARDS:18581}  Trauma: {YES/NO/WILD CARDS:18581}  New type of headache: {YES/NO/WILD CARDS:18581}  Anticoagulant use: {YES/NO/WILD CARDS:18581}  H/o cancer/HIV/Pregnancy: {YES/NO/WILD CARDS:18581}  Effect on social functioning:  {Blank single:19197::"yes","no"} Confusion:  {Blank single:19197::"yes","no"} Gait disturbance/ataxia:  {Blank single:19197::"yes","no"} Behavioral changes:  {Blank single:19197::"yes","no"} Fevers:  {Blank single:19197::"yes","no"}    Outpatient Encounter Medications as of 01/28/2024  Medication Sig   ibuprofen  (ADVIL ) 800 MG tablet Take 1 tablet (800 mg total) by mouth every 8 (eight) hours as needed.   naproxen  (NAPROSYN ) 500 MG tablet Take 1 tablet (500 mg total) by mouth 2 (two) times daily with a meal.   promethazine  (PHENERGAN ) 12.5 MG tablet Take 1 tablet (12.5 mg total) by mouth every 6 (six) hours as needed for nausea or vomiting.   No facility-administered encounter medications on file as of 01/28/2024.    There are no active problems to display for this patient.  No past medical history on file. No past surgical history on file. No family history on file. Social History   Socioeconomic History   Marital status: Married    Spouse name: Not on file   Number of children: Not on file   Years of education: Not on file   Highest education level: Not on file  Occupational History   Not on file  Tobacco Use   Smoking status: Never   Smokeless tobacco: Never  Substance and Sexual Activity   Alcohol use: Yes    Comment: occasionally   Drug use: Yes    Types: Marijuana   Sexual activity: Not on file  Other Topics Concern   Not on file  Social History Narrative   Not on file   Social Drivers of Health   Financial Resource  Strain: Not on file  Food Insecurity: Not on file  Transportation Needs: Not on file  Physical Activity: Not on file  Stress: Not on file  Social Connections: Not on file  Intimate Partner Violence: Not on file   Outpatient Medications Prior to Visit  Medication Sig Dispense Refill   ibuprofen  (ADVIL ) 800 MG tablet Take 1 tablet (800 mg total) by mouth every 8 (eight) hours as needed. 21 tablet 0   naproxen  (NAPROSYN ) 500  MG tablet Take 1 tablet (500 mg total) by mouth 2 (two) times daily with a meal. 30 tablet 0   promethazine  (PHENERGAN ) 12.5 MG tablet Take 1 tablet (12.5 mg total) by mouth every 6 (six) hours as needed for nausea or vomiting. 30 tablet 0   No facility-administered medications prior to visit.   Allergies  Allergen Reactions   Depakote [Divalproex Sodium]    Geodon [Ziprasidone]    Levaquin [Levofloxacin]      ROS      Objective   There were no vitals filed for this visit.  GENERAL: Well-appearing, in NAD. Well nourished.  SKIN: Pink, warm and dry. No rash, lesion, ulceration, or ecchymoses.  Head: Normocephalic. NECK: Trachea midline. Full ROM w/o pain or tenderness. No lymphadenopathy.  EARS: Tympanic membranes are intact, translucent without bulging and without drainage. Appropriate landmarks visualized.  EYES: Conjunctiva clear without exudates. EOMI, PERRL, no drainage present.  NOSE: Septum midline w/o deformity. Nares patent, mucosa pink and non-inflamed w/o drainage. No sinus tenderness.  THROAT: Uvula midline. Oropharynx clear. Tonsils non-inflamed without exudate. Mucous membranes pink and moist.  RESPIRATORY: Chest wall symmetrical. Respirations even and non-labored. Breath sounds clear to auscultation bilaterally.  CARDIAC: S1, S2 present, regular rate and rhythm without murmur or gallops. Peripheral pulses 2+ bilaterally.  MSK: Muscle tone and strength appropriate for age. Joints w/o tenderness, redness, or swelling.  EXTREMITIES: Without  clubbing, cyanosis, or edema.  NEUROLOGIC: No motor or sensory deficits. Steady, even gait. C2-C12 intact.  PSYCH/MENTAL STATUS: Alert, oriented x 3. Cooperative, appropriate mood and affect.       Assessment & Plan:   ***  Headache status at time of visit: {Blank single:19197::"current headache","asymptomatic"} Ocular symptoms: Denies any vision loss, eye pain, blurred vision Neurologic changes: Denies any numbness or tingling, difficulties, gait abnormalities Family history: Denies any family history of headaches or migraines  Denies sudden onset, photophobia, phonophobia, syncope, weakness, slurred speech, flashes/floaters. Normal neurological exam. EOMs and PERRLA intact. No slurred speech. Patient able to communicate and answer all questions appropriately. No weakness present. Denies current headache at this time. Reports relief with OTC medication- advised him to continue taking at this time. Return to office if headache persists or worsens.    No follow-ups on file.   Wilhelmena Hanson, FNP
